# Patient Record
Sex: Female | Born: 1970 | Race: White | Hispanic: No | Marital: Married | Smoking: Never smoker
Health system: Southern US, Community
[De-identification: ages and names within clinical notes are randomized; demographics above are authoritative.]

## PROBLEM LIST (undated history)

## (undated) DIAGNOSIS — F419 Anxiety disorder, unspecified: Secondary | ICD-10-CM

## (undated) DIAGNOSIS — I10 Essential (primary) hypertension: Secondary | ICD-10-CM

## (undated) DIAGNOSIS — E063 Autoimmune thyroiditis: Secondary | ICD-10-CM

## (undated) DIAGNOSIS — E7800 Pure hypercholesterolemia, unspecified: Secondary | ICD-10-CM

## (undated) DIAGNOSIS — C966 Unifocal Langerhans-cell histiocytosis: Secondary | ICD-10-CM

## (undated) DIAGNOSIS — J45909 Unspecified asthma, uncomplicated: Secondary | ICD-10-CM

## (undated) HISTORY — PX: ENDOMETRIAL ABLATION: SHX621

## (undated) HISTORY — PX: NASAL SINUS SURGERY: SHX719

## (undated) HISTORY — PX: CHOLECYSTECTOMY: SHX55

---

## 2016-04-30 ENCOUNTER — Ambulatory Visit
Admission: RE | Admit: 2016-04-30 | Discharge: 2016-04-30 | Disposition: A | Discharge status: Home / Self Care | Payer: BLUE CROSS/BLUE SHIELD | Source: Ambulatory Visit | Attending: Obstetrics and Gynecology | Admitting: Obstetrics and Gynecology

## 2016-04-30 ENCOUNTER — Other Ambulatory Visit: Payer: Self-pay | Admitting: Obstetrics and Gynecology

## 2016-04-30 DIAGNOSIS — Z1231 Encounter for screening mammogram for malignant neoplasm of breast: Secondary | ICD-10-CM | POA: Diagnosis present

## 2016-05-10 ENCOUNTER — Inpatient Hospital Stay
Admission: RE | Admit: 2016-05-10 | Discharge: 2016-05-10 | Disposition: A | Discharge status: Home / Self Care | Payer: Self-pay | Source: Ambulatory Visit | Attending: *Deleted | Admitting: *Deleted

## 2016-05-10 ENCOUNTER — Other Ambulatory Visit: Payer: Self-pay | Admitting: *Deleted

## 2016-05-10 DIAGNOSIS — Z9289 Personal history of other medical treatment: Secondary | ICD-10-CM

## 2017-04-22 ENCOUNTER — Other Ambulatory Visit: Payer: Self-pay | Admitting: Obstetrics & Gynecology

## 2017-04-22 DIAGNOSIS — Z1231 Encounter for screening mammogram for malignant neoplasm of breast: Secondary | ICD-10-CM

## 2018-06-05 ENCOUNTER — Other Ambulatory Visit: Payer: Self-pay | Admitting: Obstetrics & Gynecology

## 2018-06-05 DIAGNOSIS — Z1231 Encounter for screening mammogram for malignant neoplasm of breast: Secondary | ICD-10-CM

## 2018-06-13 ENCOUNTER — Ambulatory Visit
Admission: RE | Admit: 2018-06-13 | Discharge: 2018-06-13 | Disposition: A | Discharge status: Home / Self Care | Payer: BLUE CROSS/BLUE SHIELD | Source: Ambulatory Visit | Attending: Obstetrics & Gynecology | Admitting: Obstetrics & Gynecology

## 2018-06-13 DIAGNOSIS — Z1231 Encounter for screening mammogram for malignant neoplasm of breast: Secondary | ICD-10-CM | POA: Diagnosis not present

## 2019-06-06 ENCOUNTER — Other Ambulatory Visit: Payer: Self-pay | Admitting: Obstetrics & Gynecology

## 2019-06-06 DIAGNOSIS — Z1231 Encounter for screening mammogram for malignant neoplasm of breast: Secondary | ICD-10-CM

## 2019-08-31 ENCOUNTER — Encounter: Payer: Self-pay | Admitting: Emergency Medicine

## 2019-08-31 ENCOUNTER — Telehealth: Payer: Self-pay | Admitting: Emergency Medicine

## 2019-08-31 ENCOUNTER — Other Ambulatory Visit: Payer: Self-pay

## 2019-08-31 ENCOUNTER — Emergency Department
Admission: EM | Admit: 2019-08-31 | Discharge: 2019-08-31 | Disposition: A | Discharge status: Home / Self Care | Payer: BLUE CROSS/BLUE SHIELD | Attending: Emergency Medicine | Admitting: Emergency Medicine

## 2019-08-31 DIAGNOSIS — R000 Tachycardia, unspecified: Secondary | ICD-10-CM | POA: Insufficient documentation

## 2019-08-31 DIAGNOSIS — Z5321 Procedure and treatment not carried out due to patient leaving prior to being seen by health care provider: Secondary | ICD-10-CM | POA: Insufficient documentation

## 2019-08-31 HISTORY — DX: Unifocal Langerhans-cell histiocytosis: C96.6

## 2019-08-31 HISTORY — DX: Pure hypercholesterolemia, unspecified: E78.00

## 2019-08-31 HISTORY — DX: Unspecified asthma, uncomplicated: J45.909

## 2019-08-31 HISTORY — DX: Autoimmune thyroiditis: E06.3

## 2019-08-31 HISTORY — DX: Anxiety disorder, unspecified: F41.9

## 2019-08-31 HISTORY — DX: Essential (primary) hypertension: I10

## 2019-08-31 LAB — COMPREHENSIVE METABOLIC PANEL
ALT: 15 U/L (ref 0–44)
AST: 20 U/L (ref 15–41)
Albumin: 4.2 g/dL (ref 3.5–5.0)
Alkaline Phosphatase: 59 U/L (ref 38–126)
Anion gap: 10 (ref 5–15)
BUN: 12 mg/dL (ref 6–20)
CO2: 24 mmol/L (ref 22–32)
Calcium: 9.2 mg/dL (ref 8.9–10.3)
Chloride: 103 mmol/L (ref 98–111)
Creatinine, Ser: 0.63 mg/dL (ref 0.44–1.00)
GFR calc Af Amer: >60 mL/min (ref >60)
GFR calc non Af Amer: >60 mL/min (ref >60)
Glucose, Bld: 129 mg/dL — ABNORMAL HIGH (ref 70–99)
Potassium: 3.6 mmol/L (ref 3.5–5.1)
Sodium: 137 mmol/L (ref 135–145)
Total Bilirubin: 0.7 mg/dL (ref 0.3–1.2)
Total Protein: 7.2 g/dL (ref 6.5–8.1)

## 2019-08-31 LAB — CBC WITH DIFFERENTIAL/PLATELET
Abs Immature Granulocytes: 0.06 10*3/uL (ref 0.00–0.07)
Basophils Absolute: 0.1 10*3/uL (ref 0.0–0.1)
Basophils Relative: 1 %
Eosinophils Absolute: 0 10*3/uL (ref 0.0–0.5)
Eosinophils Relative: 0 %
HCT: 41.5 % (ref 36.0–46.0)
Hemoglobin: 14.1 g/dL (ref 12.0–15.0)
Immature Granulocytes: 1 %
Lymphocytes Relative: 14 %
Lymphs Abs: 1.3 10*3/uL (ref 0.7–4.0)
MCH: 30.3 pg (ref 26.0–34.0)
MCHC: 34 g/dL (ref 30.0–36.0)
MCV: 89.1 fL (ref 80.0–100.0)
Monocytes Absolute: 0.5 10*3/uL (ref 0.1–1.0)
Monocytes Relative: 5 %
Neutro Abs: 7.1 10*3/uL (ref 1.7–7.7)
Neutrophils Relative %: 79 %
Platelets: 254 10*3/uL (ref 150–400)
RBC: 4.66 MIL/uL (ref 3.87–5.11)
RDW: 12.1 % (ref 11.5–15.5)
WBC: 9 10*3/uL (ref 4.0–10.5)
nRBC: 0 % (ref 0.0–0.2)

## 2019-08-31 LAB — TROPONIN I (HIGH SENSITIVITY): Troponin I (High Sensitivity): 4 ng/L (ref <18)

## 2019-08-31 NOTE — ED Triage Notes (Signed)
Patient ambulatory to triage with steady gait, without difficulty or distress noted, mask in place; pt reports tachycardia with no accomp symptoms; denies hx of same; st rx metoprolol for "anxiety"

## 2019-08-31 NOTE — Telephone Encounter (Signed)
Called patient due to lwot to inquire about condition and follow up plans. Says her rate came down this am.  She has appt with her dr on Monday.  I told her to return as needed an time.

## 2019-10-08 ENCOUNTER — Ambulatory Visit
Admission: RE | Admit: 2019-10-08 | Discharge: 2019-10-08 | Disposition: A | Discharge status: Home / Self Care | Payer: BC Managed Care – PPO | Source: Ambulatory Visit | Attending: Obstetrics & Gynecology | Admitting: Obstetrics & Gynecology

## 2019-10-08 ENCOUNTER — Other Ambulatory Visit: Payer: Self-pay

## 2019-10-08 DIAGNOSIS — Z1231 Encounter for screening mammogram for malignant neoplasm of breast: Secondary | ICD-10-CM | POA: Diagnosis not present

## 2020-05-10 IMAGING — MG DIGITAL SCREENING BILAT W/ TOMO W/ CAD
8 series · 8 of 24 positions shown · non-contrast
Comparison: Previous exam(s).

CLINICAL DATA: Screening.

EXAM:
DIGITAL SCREENING BILATERAL MAMMOGRAM WITH TOMO AND CAD

[L MLO synth-2D]
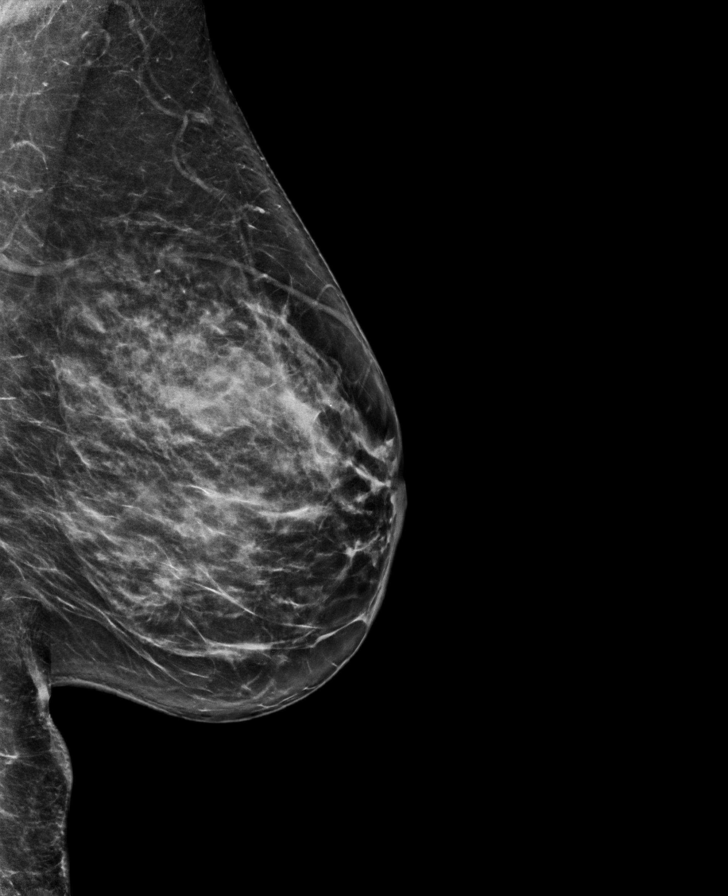

[R CC synth-2D]
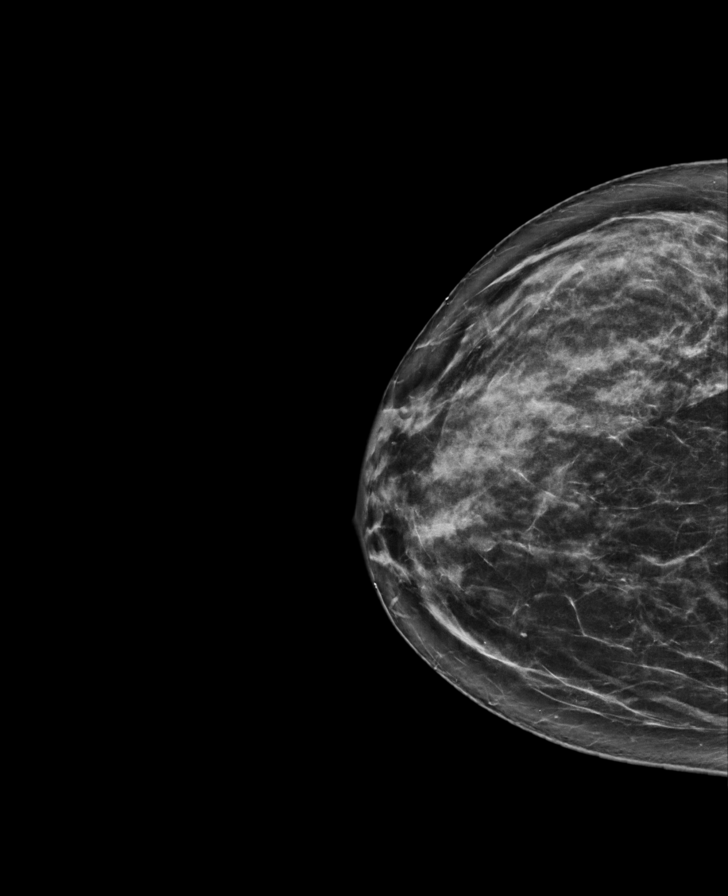

[L CC synth-2D]
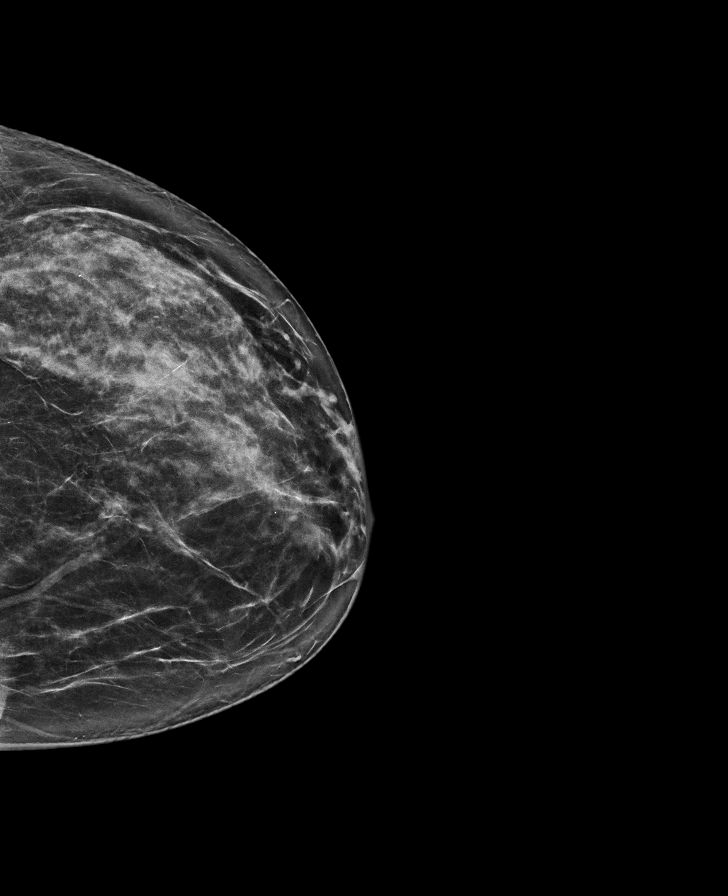

[R MLO synth-2D]
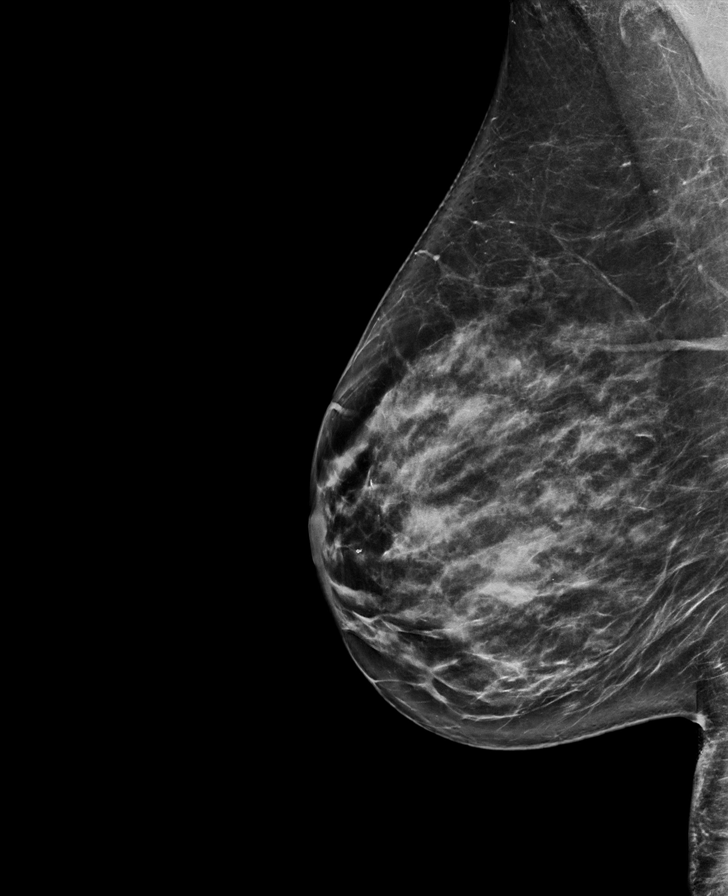

[R MLO tomo · tomo slice 40/79.0]
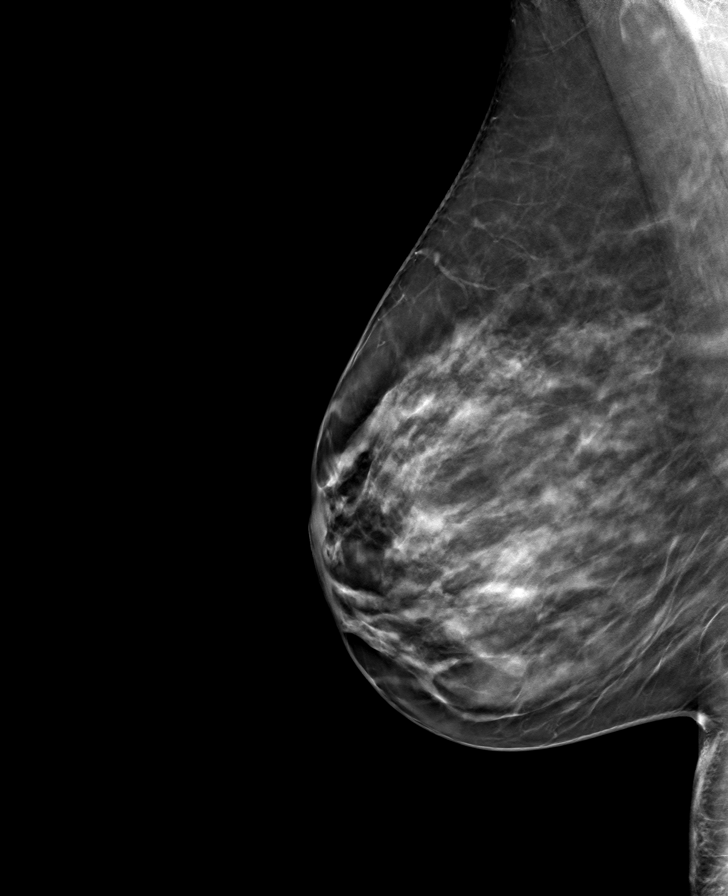

[L CC tomo · tomo slice 40/79.0]
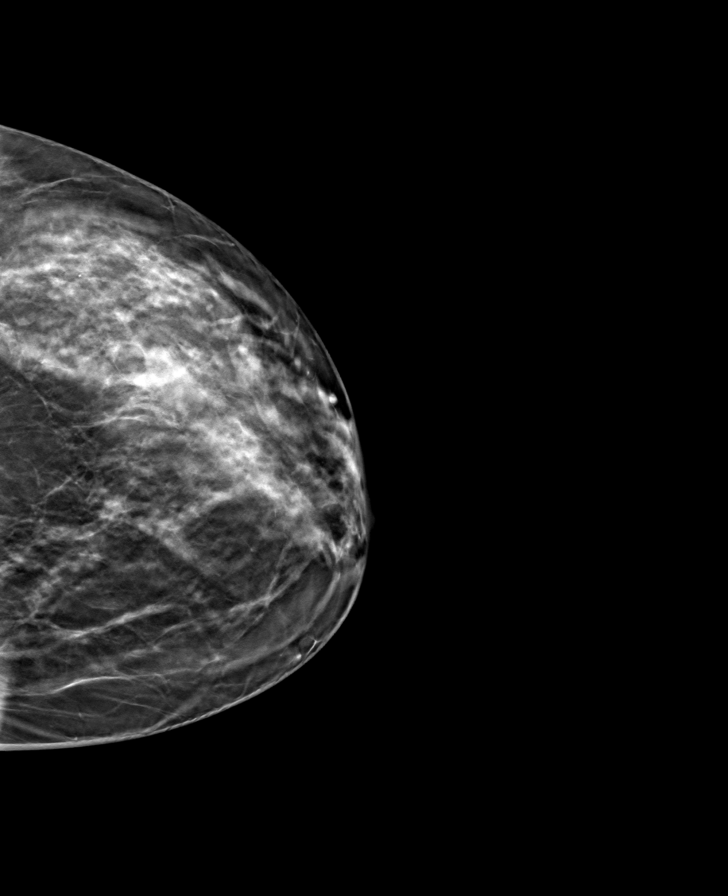

[L MLO tomo · tomo slice 39/76.0]
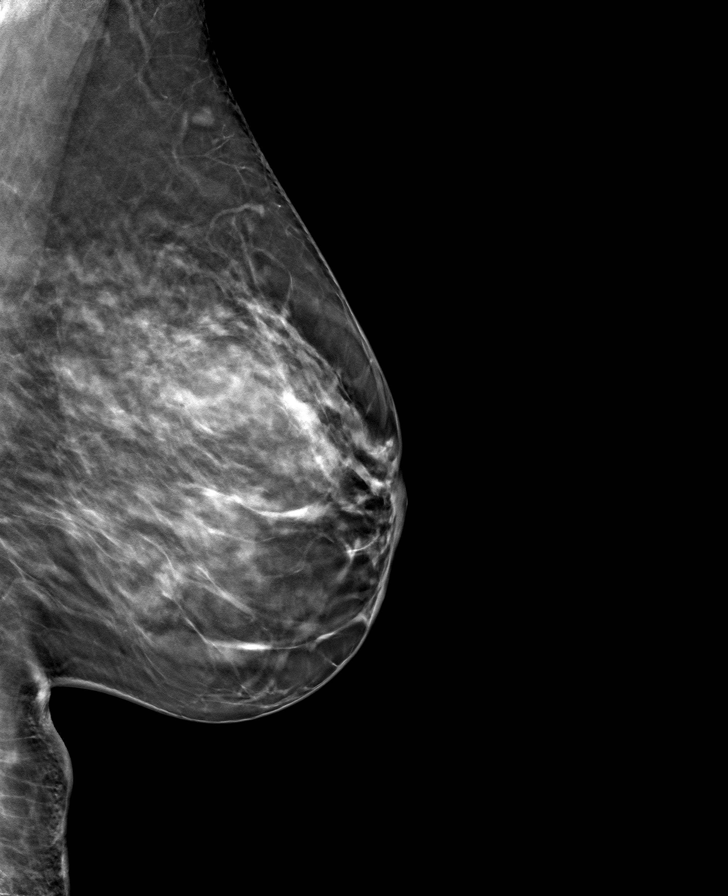

[R CC tomo · tomo slice 39/76.0]
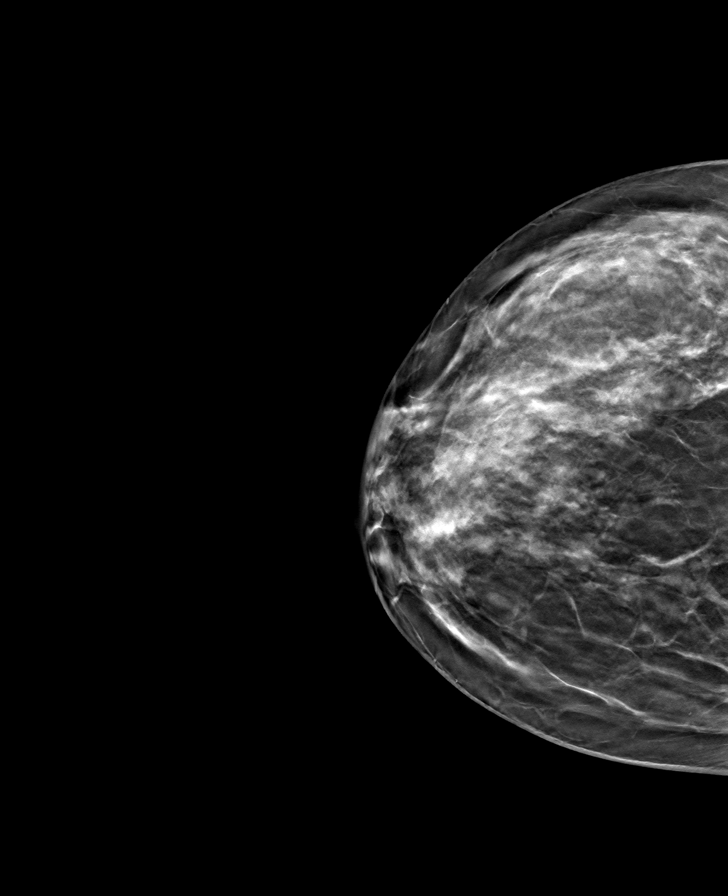

[8 of 24 positions shown; findings below may reference images not displayed]

ACR Breast Density Category c: The breast tissue is heterogeneously
dense, which may obscure small masses.
FINDINGS: There are no findings suspicious for malignancy. Images were
processed with CAD.
IMPRESSION: No mammographic evidence of malignancy. A result letter of this
screening mammogram will be mailed directly to the patient.

RECOMMENDATION:
Screening mammogram in one year. (Code:[id number])

BI-RADS CATEGORY  1: Negative.

## 2020-09-10 ENCOUNTER — Other Ambulatory Visit: Payer: Self-pay | Admitting: Obstetrics & Gynecology

## 2020-09-10 DIAGNOSIS — Z1231 Encounter for screening mammogram for malignant neoplasm of breast: Secondary | ICD-10-CM

## 2020-09-27 LAB — EXTERNAL GENERIC LAB PROCEDURE: COLOGUARD: NEGATIVE

## 2020-09-27 LAB — COLOGUARD: COLOGUARD: NEGATIVE

## 2020-10-22 ENCOUNTER — Ambulatory Visit
Admission: RE | Admit: 2020-10-22 | Discharge: 2020-10-22 | Disposition: A | Discharge status: Home / Self Care | Payer: Managed Care, Other (non HMO) | Source: Ambulatory Visit | Attending: Obstetrics & Gynecology | Admitting: Obstetrics & Gynecology

## 2020-10-22 ENCOUNTER — Other Ambulatory Visit: Payer: Self-pay

## 2020-10-22 DIAGNOSIS — Z1231 Encounter for screening mammogram for malignant neoplasm of breast: Secondary | ICD-10-CM | POA: Insufficient documentation

## 2021-03-23 ENCOUNTER — Other Ambulatory Visit: Payer: Self-pay | Admitting: Sports Medicine

## 2021-03-23 DIAGNOSIS — Z8579 Personal history of other malignant neoplasms of lymphoid, hematopoietic and related tissues: Secondary | ICD-10-CM

## 2021-03-23 DIAGNOSIS — G8929 Other chronic pain: Secondary | ICD-10-CM

## 2021-03-23 DIAGNOSIS — M25562 Pain in left knee: Secondary | ICD-10-CM

## 2021-03-23 DIAGNOSIS — M7989 Other specified soft tissue disorders: Secondary | ICD-10-CM

## 2021-04-03 ENCOUNTER — Other Ambulatory Visit: Payer: Self-pay

## 2021-04-03 ENCOUNTER — Ambulatory Visit
Admission: RE | Admit: 2021-04-03 | Discharge: 2021-04-03 | Disposition: A | Discharge status: Home / Self Care | Payer: Managed Care, Other (non HMO) | Source: Ambulatory Visit | Attending: Sports Medicine | Admitting: Sports Medicine

## 2021-04-03 DIAGNOSIS — G8929 Other chronic pain: Secondary | ICD-10-CM

## 2021-04-03 DIAGNOSIS — M25562 Pain in left knee: Secondary | ICD-10-CM | POA: Diagnosis present

## 2021-04-03 DIAGNOSIS — M7989 Other specified soft tissue disorders: Secondary | ICD-10-CM

## 2021-04-03 DIAGNOSIS — Z8579 Personal history of other malignant neoplasms of lymphoid, hematopoietic and related tissues: Secondary | ICD-10-CM

## 2021-06-09 ENCOUNTER — Other Ambulatory Visit: Payer: Self-pay | Admitting: Orthopedic Surgery

## 2021-06-09 DIAGNOSIS — M25562 Pain in left knee: Secondary | ICD-10-CM

## 2021-06-09 DIAGNOSIS — G8929 Other chronic pain: Secondary | ICD-10-CM

## 2021-08-28 ENCOUNTER — Other Ambulatory Visit: Payer: Self-pay | Admitting: Obstetrics and Gynecology

## 2021-08-28 DIAGNOSIS — Z1231 Encounter for screening mammogram for malignant neoplasm of breast: Secondary | ICD-10-CM

## 2021-10-23 ENCOUNTER — Ambulatory Visit
Admission: RE | Admit: 2021-10-23 | Discharge: 2021-10-23 | Disposition: A | Discharge status: Home / Self Care | Payer: Managed Care, Other (non HMO) | Source: Ambulatory Visit | Attending: Obstetrics and Gynecology | Admitting: Obstetrics and Gynecology

## 2021-10-23 DIAGNOSIS — Z1231 Encounter for screening mammogram for malignant neoplasm of breast: Secondary | ICD-10-CM | POA: Diagnosis present

## 2021-10-29 ENCOUNTER — Other Ambulatory Visit: Payer: Self-pay | Admitting: Obstetrics and Gynecology

## 2021-10-29 DIAGNOSIS — R921 Mammographic calcification found on diagnostic imaging of breast: Secondary | ICD-10-CM

## 2021-10-29 DIAGNOSIS — R928 Other abnormal and inconclusive findings on diagnostic imaging of breast: Secondary | ICD-10-CM

## 2021-11-08 ENCOUNTER — Ambulatory Visit
Admission: EM | Admit: 2021-11-08 | Discharge: 2021-11-08 | Disposition: A | Discharge status: Home / Self Care | Payer: Managed Care, Other (non HMO) | Attending: Family Medicine | Admitting: Family Medicine

## 2021-11-08 ENCOUNTER — Encounter: Payer: Self-pay | Admitting: Emergency Medicine

## 2021-11-08 DIAGNOSIS — K5730 Diverticulosis of large intestine without perforation or abscess without bleeding: Secondary | ICD-10-CM

## 2021-11-08 DIAGNOSIS — K589 Irritable bowel syndrome without diarrhea: Secondary | ICD-10-CM

## 2021-11-08 DIAGNOSIS — R1032 Left lower quadrant pain: Secondary | ICD-10-CM

## 2021-11-08 LAB — POCT URINALYSIS DIP (MANUAL ENTRY)
Bilirubin, UA: NEGATIVE
Blood, UA: NEGATIVE
Glucose, UA: NEGATIVE mg/dL
Ketones, POC UA: NEGATIVE mg/dL
Leukocytes, UA: NEGATIVE
Nitrite, UA: NEGATIVE
Protein Ur, POC: NEGATIVE mg/dL
Spec Grav, UA: <=1.005 — AB (ref 1.010–1.025)
Urobilinogen, UA: 0.2 E.U./dL
pH, UA: 6 (ref 5.0–8.0)

## 2021-11-08 MED ORDER — DICYCLOMINE HCL 20 MG PO TABS: 20.0000 mg | ORAL_TABLET | Freq: Three times a day (TID) | ORAL | 0 refills | Status: AC

## 2021-11-08 MED ORDER — AMOXICILLIN-POT CLAVULANATE 875-125 MG PO TABS: 1.0000 | ORAL_TABLET | Freq: Two times a day (BID) | ORAL | 0 refills | Status: AC

## 2021-11-08 NOTE — Discharge Instructions (Signed)
Treating you for suspected Diverticulitis. ?Take Augmentin twice daily for 10 days. ?Bentyl take with meals and at bedtime as needed for abdominal cramping.  ?Schedule a follow-up with GI within 7-10 days ?

## 2021-11-08 NOTE — ED Triage Notes (Signed)
Pt presents with left side abdominal pain near her belly button x 3 days. She describes the pain as a sharp gas pain. She does have a history of IBS she feels bloated and a lot of belching. She has a case of diarrhea this morning.  ?

## 2021-11-08 NOTE — ED Provider Notes (Addendum)
?National ? ? ? ?CSN: [id number] ?Arrival date & time: 11/08/21  1045 ? ? ?  ? ?History   ?Chief Complaint ?Chief Complaint  ?Patient presents with  ? Abdominal Pain  ? ? ?HPI ?Amber Holden is a 51 y.o. female.  ? ?HPI ?Patient presents for evaluation of left sided lower abdominal pain, bloating x 3 days and diarrhea today. She describes pain as occasionally sharp. Has a history of diverticular disease however has never had diverticulitis.Denies fever, chills.  Patient has history of a cholecystectomy.  Her appendix remains intact.  She denies any rectal bleeding or noticing blood in her stool.  She also has a history of IBS. Denies nausea or vomiting. No fever. ?Past Medical History:  ?Diagnosis Date  ? Anxiety   ? Asthma   ? 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 Hashimoto's thyroiditis   ? Hypercholesteremia   ? Hypertension   ? ? ?There are no problems to display for this patient. ? ? ?Past Surgical History:  ?Procedure Laterality Date  ? CHOLECYSTECTOMY    ? ENDOMETRIAL ABLATION    ? NASAL SINUS SURGERY    ? ? ?OB History   ?No obstetric history on file. ?  ? ? ? ?Home Medications   ? ?Prior to Admission medications   ?Medication Sig Start Date End Date Taking? Authorizing Provider  ?ALPRAZolam (XANAX) 0.25 MG tablet Take by mouth. 07/30/16  Yes [provider]  ?amoxicillin-clavulanate (AUGMENTIN) 875-125 MG tablet Take 1 tablet by mouth 2 (two) times daily. 11/08/21  Yes Scot Jun, FNP  ?cetirizine (ZYRTEC) 10 MG tablet Zyrtec 10 mg tablet ?  1 tablet every day by oral route.   Yes [provider]  ?Cholecalciferol 25 MCG (1000 UT) tablet Take by mouth.   Yes [provider]  ?dicyclomine (BENTYL) 20 MG tablet Take 1 tablet (20 mg total) by mouth 3 (three) times daily before meals for 7 days. 11/08/21 11/15/21 Yes Scot Jun, FNP  ?Krill Oil 1000 MG CAPS Take by mouth.   Yes [provider]  ?lansoprazole (PREVACID) 15 MG capsule  05/29/13  Yes [provider]  ?metoprolol succinate (TOPROL-XL) 50 MG 24 hr tablet Take 50 mg by mouth daily. 10/12/21  Yes [provider]  ?montelukast (SINGULAIR) 10 MG tablet Take 1 tablet by mouth at bedtime. 05/18/13  Yes [provider]  ?Omega-3 Fatty Acids (FISH OIL) 1000 MG CAPS Take by mouth.   Yes [provider]  ?QVAR REDIHALER 40 MCG/ACT inhaler Inhale 2 puffs into the lungs 2 (two) times daily. 07/17/21  Yes [provider]  ?rosuvastatin (CRESTOR) 10 MG tablet Crestor 10 mg tablet ?  1 tablet every other day by oral route. 02/21/15  Yes [provider]  ?saccharomyces boulardii (FLORASTOR) 250 MG capsule Take 250 mg by mouth 2 (two) times daily.   Yes [provider]  ? ? ?Family History ?Family History  ?Problem Relation Age of Onset  ? Breast cancer Maternal Aunt   ? ? ?Social History ?Social History  ? ?Tobacco Use  ? Smoking status: Never  ? Smokeless tobacco: Never  ?Vaping Use  ? Vaping Use: Never used  ? ? ? ?Allergies   ?Clarithromycin, Sulfamethoxazole-trimethoprim, Ciprofloxacin, Epinephrine, Fluconazole, Quinolones, Sulfa antibiotics, and Ondansetron ? ? ?Review of Systems ?Review of Systems ?Pertinent negatives listed in HPI  ?Physical Exam ?Triage Vital Signs ?ED Triage Vitals  ?Enc Vitals Group  ?   BP 11/08/21 1142 (!) 142/84  ?  Pulse Rate 11/08/21 1142 73  ?   Resp 11/08/21 1142 18  ?   Temp 11/08/21 1142 98.1 ?F (36.7 ?C)  ?   Temp Source 11/08/21 1142 Oral  ?   SpO2 11/08/21 1142 98 %  ?   Weight --   ?   Height --   ?   Head Circumference --   ?   Peak Flow --   ?   Pain Score 11/08/21 1139 1  ?   Pain Loc --   ?   Pain Edu? --   ?   Excl. in [place]? --   ? ?No data found. ? ?Updated Vital Signs ?BP (!) 142/84 (BP Location: Left Arm)   Pulse 73   Temp 98.1 ?F (36.7 ?C) (Oral)   Resp 18   SpO2 98%  ? ?Visual Acuity ?Right Eye Distance:   ?Left Eye Distance:   ?Bilateral Distance:   ? ?Right Eye Near:   ?Left Eye Near:    ?Bilateral Near:     ? ?Physical Exam ?Constitutional:   ?   General: She is not in acute distress. ?   Appearance: She is well-developed. She is not ill-appearing.  ?HENT:  ?   Head: Normocephalic and atraumatic.  ?Eyes:  ?   Extraocular Movements: Extraocular movements intact.  ?   Pupils: Pupils are equal, round, and reactive to light.  ?Cardiovascular:  ?   Rate and Rhythm: Normal rate and regular rhythm.  ?Abdominal:  ?   General: Abdomen is flat. Bowel sounds are normal. There is no distension.  ?   Palpations: Abdomen is soft.  ?   Tenderness: There is no abdominal tenderness.  ?   Hernia: No hernia is present.  ?Skin: ?   General: Skin is warm and dry.  ?   Capillary Refill: Capillary refill takes less than 2 seconds.  ?Neurological:  ?   General: No focal deficit present.  ?   Mental Status: She is alert.  ?Psychiatric:     ?   Mood and Affect: Mood normal.     ?   Behavior: Behavior normal.  ? ? ? ?UC Treatments / Results  ?Labs ?(all labs ordered are listed, but only abnormal results are displayed) ?Labs Reviewed  ?POCT URINALYSIS DIP (MANUAL ENTRY) - Abnormal; Notable for the following components:  ?    Result Value  ? Spec Grav, UA <=1.005 (*)   ? All other components within normal limits  ? ? ?EKG ? ? ?Radiology ? ? ?Procedures ?Procedures (including critical care time) ? ?Medications Ordered in UC ?Medications - No data to display ? ?Initial Impression / Assessment and Plan / UC Course  ?I have reviewed the triage vital signs and the nursing notes. ? ?Pertinent labs & imaging results that were available during my care of the patient were reviewed by me and considered in my medical decision making (see chart for details). ? ?  ?Treating for suspected diverticulitis with Augmentin and Dicyclomine. ?Strict ER precautions given if symptoms worsen and do no readily improve. Schedule a follow-up with GI within 7-10 days. Patient verbalized understanding and agreement with plan. ?Final Clinical Impressions(s) / UC Diagnoses   ? ?Final diagnoses:  ?Abdominal pain, left lower quadrant  ?Irritable bowel syndrome, unspecified type  ?Diverticula of colon  ? ? ? ?Discharge Instructions   ? ?  ?Treating you for suspected Diverticulitis. ?Take Augmentin twice daily for 10 days. ?Bentyl take with meals and at bedtime as needed  for abdominal cramping.  ?Schedule a follow-up with GI within 7-10 days ? ? ? ? ?ED Prescriptions   ? ? Medication Sig Dispense Auth. Provider  ? amoxicillin-clavulanate (AUGMENTIN) 875-125 MG tablet Take 1 tablet by mouth 2 (two) times daily. 20 tablet Scot Jun, FNP  ? dicyclomine (BENTYL) 20 MG tablet Take 1 tablet (20 mg total) by mouth 3 (three) times daily before meals for 7 days. 20 tablet Scot Jun, FNP  ? ?  ? ?PDMP not reviewed this encounter. ?  ?Scot Jun, FNP ?11/11/21 2319 ? ?  ?Scot Jun, FNP ?11/11/21 2320 ? ?

## 2021-11-11 ENCOUNTER — Ambulatory Visit
Admission: RE | Admit: 2021-11-11 | Discharge: 2021-11-11 | Disposition: A | Discharge status: Home / Self Care | Payer: Managed Care, Other (non HMO) | Source: Ambulatory Visit | Attending: Obstetrics and Gynecology | Admitting: Obstetrics and Gynecology

## 2021-11-11 ENCOUNTER — Encounter: Payer: Self-pay | Admitting: Radiology

## 2021-11-11 DIAGNOSIS — R921 Mammographic calcification found on diagnostic imaging of breast: Secondary | ICD-10-CM | POA: Insufficient documentation

## 2021-11-11 DIAGNOSIS — R928 Other abnormal and inconclusive findings on diagnostic imaging of breast: Secondary | ICD-10-CM | POA: Insufficient documentation

## 2023-02-10 ENCOUNTER — Other Ambulatory Visit: Payer: Self-pay | Admitting: Orthopedic Surgery

## 2023-02-10 DIAGNOSIS — M898X1 Other specified disorders of bone, shoulder: Secondary | ICD-10-CM

## 2023-02-14 ENCOUNTER — Encounter: Payer: Self-pay | Admitting: Orthopedic Surgery

## 2023-11-24 ENCOUNTER — Emergency Department: Admission: EM | Admit: 2023-11-24 | Discharge: 2023-11-24 | Discharge status: Against Medical Advice | Payer: Self-pay

## 2023-11-24 NOTE — ED Triage Notes (Signed)
 First nurse note: brought over from [facility]. Has been having rectal pressure and abdominal pain that started this AM. Normal BM. Reports bloating.   Nurse from [facility] reported that as soon as patient checked into ER she told staff she was leaving and left ER. Patient seen by [facility] staff walking out of ER.  Pharmacist, community

## 2024-03-05 ENCOUNTER — Ambulatory Visit

## 2024-03-05 ENCOUNTER — Encounter: Payer: Self-pay | Admitting: Gastroenterology

## 2024-03-05 ENCOUNTER — Ambulatory Visit
Admission: RE | Admit: 2024-03-05 | Discharge: 2024-03-05 | Disposition: A | Discharge status: Home / Self Care | Attending: Gastroenterology | Admitting: Gastroenterology

## 2024-03-05 ENCOUNTER — Encounter
Admission: RE | Disposition: A | Discharge status: Home / Self Care | Payer: Self-pay | Source: Home / Self Care | Attending: Gastroenterology

## 2024-03-05 DIAGNOSIS — Z8719 Personal history of other diseases of the digestive system: Secondary | ICD-10-CM | POA: Insufficient documentation

## 2024-03-05 DIAGNOSIS — Z923 Personal history of irradiation: Secondary | ICD-10-CM | POA: Insufficient documentation

## 2024-03-05 DIAGNOSIS — Z9221 Personal history of antineoplastic chemotherapy: Secondary | ICD-10-CM | POA: Insufficient documentation

## 2024-03-05 DIAGNOSIS — Z09 Encounter for follow-up examination after completed treatment for conditions other than malignant neoplasm: Secondary | ICD-10-CM | POA: Diagnosis present

## 2024-03-05 DIAGNOSIS — K56699 Other intestinal obstruction unspecified as to partial versus complete obstruction: Secondary | ICD-10-CM | POA: Insufficient documentation

## 2024-03-05 DIAGNOSIS — K5730 Diverticulosis of large intestine without perforation or abscess without bleeding: Secondary | ICD-10-CM | POA: Diagnosis not present

## 2024-03-05 HISTORY — PX: COLONOSCOPY: SHX5424

## 2024-03-05 LAB — POCT PREGNANCY, URINE: Preg Test, Ur: NEGATIVE

## 2024-03-05 SURGERY — COLONOSCOPY
Anesthesia: General

## 2024-03-05 MED ORDER — SODIUM CHLORIDE 0.9 % IV SOLN
INTRAVENOUS | Status: DC
  Administered 2024-03-05 (×2): 20 mL/h via INTRAVENOUS

## 2024-03-05 MED ORDER — PROPOFOL 500 MG/50ML IV EMUL
INTRAVENOUS | Status: DC | PRN
  Administered 2024-03-05 (×2): 50 mg via INTRAVENOUS
  Administered 2024-03-05 (×2): 150 ug/kg/min via INTRAVENOUS

## 2024-03-05 NOTE — Anesthesia Preprocedure Evaluation (Signed)
 Anesthesia Evaluation  Patient identified by MRN, date of birth, ID band Patient awake    Reviewed: Allergy & Precautions, H&P , NPO status , Patient's Chart, lab work & pertinent test results, reviewed documented beta blocker date and time   Airway Mallampati: II   Neck ROM: full    Dental  (+) Poor Dentition   Pulmonary neg shortness of breath, asthma    Pulmonary exam normal        Cardiovascular Exercise Tolerance: Good hypertension, On Medications negative cardio ROS Normal cardiovascular exam Rhythm:regular Rate:Normal     Neuro/Psych   Anxiety     negative neurological ROS  negative psych ROS   GI/Hepatic negative GI ROS, Neg liver ROS,,,  Endo/Other  negative endocrine ROS    Renal/GU negative Renal ROS  negative genitourinary   Musculoskeletal   Abdominal   Peds  Hematology negative hematology ROS (+)   Anesthesia Other Findings Past Medical History: No date: Anxiety No date: Asthma No date: Eosinophilic granuloma ([facility]) No date: Hashimoto's thyroiditis No date: Hypercholesteremia No date: Hypertension Past Surgical History: No date: CHOLECYSTECTOMY No date: ENDOMETRIAL ABLATION No date: NASAL SINUS SURGERY BMI    Body Mass Index: 25.09 kg/m     Reproductive/Obstetrics negative OB ROS                              Anesthesia Physical Anesthesia Plan  ASA: 2  Anesthesia Plan: General   Post-op Pain Management:    Induction:   PONV Risk Score and Plan:   Airway Management Planned:   Additional Equipment:   Intra-op Plan:   Post-operative Plan:   Informed Consent: I have reviewed the patients History and Physical, chart, labs and discussed the procedure including the risks, benefits and alternatives for the proposed anesthesia with the patient or authorized representative who has indicated his/her understanding and acceptance.     Dental Advisory  Given  Plan Discussed with: CRNA  Anesthesia Plan Comments:         Anesthesia Quick Evaluation

## 2024-03-05 NOTE — Interval H&P Note (Signed)
 History and Physical Interval Note: Preprocedure H&P from 03/05/24  was reviewed and there was no interval change after seeing and examining the patient.  Written consent was obtained from the patient after discussion of risks, benefits, and alternatives. Patient has consented to proceed with Colonoscopy with possible intervention   03/05/2024 11:13 AM  Santana Sharps  has presented today for surgery, with the diagnosis of Z87.19 (ICD-10-CM) - Hx of diverticulitis of colon.  The various methods of treatment have been discussed with the patient and family. After consideration of risks, benefits and other options for treatment, the patient has consented to  Procedure(s): COLONOSCOPY (N/A) as a surgical intervention.  The patient's history has been reviewed, patient examined, no change in status, stable for surgery.  I have reviewed the patient's chart and labs.  Questions were answered to the patient's satisfaction.     Elspeth Ozell Jungling

## 2024-03-05 NOTE — Op Note (Signed)
 [facility] Gastroenterology Patient Name: Amber Holden Procedure Date: 03/05/2024 11:04 AM MRN: [id number] Account #: [id number] Date of Birth: Dec 13, 1970 Admit Type: Outpatient Age: 53 Room: [facility] ENDO ROOM 2 Gender: Female Note Status: Finalized Instrument Name: Peds Colonoscope [id number] Procedure:             Colonoscopy Indications:           Follow-up of diverticulitis Providers:             Elspeth Ozell Jungling DO, DO Medicines:             Monitored Anesthesia Care Complications:         No immediate complications. Estimated blood loss: None. Procedure:             Pre-Anesthesia Assessment:                        - Prior to the procedure, a History and Physical was                         performed, and patient medications and allergies were                         reviewed. The patient is competent. The risks and                         benefits of the procedure and the sedation options and                         risks were discussed with the patient. All questions                         were answered and informed consent was obtained.                         Patient identification and proposed procedure were                         verified by the physician, the nurse, the anesthetist                         and the technician in the endoscopy suite. Mental                         Status Examination: alert and oriented. Airway                         Examination: normal oropharyngeal airway and neck                         mobility. Respiratory Examination: clear to                         auscultation. CV Examination: RRR, no murmurs, no S3                         or S4. Prophylactic Antibiotics: The patient does not                         require  prophylactic antibiotics. Prior                         Anticoagulants: The patient has taken no anticoagulant                         or antiplatelet agents. ASA Grade Assessment: II - A                          patient with mild systemic disease. After reviewing                         the risks and benefits, the patient was deemed in                         satisfactory condition to undergo the procedure. The                         anesthesia plan was to use monitored anesthesia care                         (MAC). Immediately prior to administration of                         medications, the patient was re-assessed for adequacy                         to receive sedatives. The heart rate, respiratory                         rate, oxygen saturations, blood pressure, adequacy of                         pulmonary ventilation, and response to care were                         monitored throughout the procedure. The physical                         status of the patient was re-assessed after the                         procedure.                        After obtaining informed consent, the colonoscope was                         passed under direct vision. Throughout the procedure,                         the patient's blood pressure, pulse, and oxygen                         saturations were monitored continuously. The                         Colonoscope was introduced through the anus and  advanced to the the cecum, identified by the ileocecal                         valve. The colonoscopy was performed with moderate                         difficulty due to multiple diverticula in the colon                         and restricted mobility, luminal narrowing. Successful                         completion of the procedure was aided by changing the                         patient to a prone position, withdrawing the scope and                         replacing with the adult endoscope, applying abdominal                         pressure and lavage. The patient tolerated the                         procedure well. The quality of the bowel preparation                          was evaluated using the BBPS [facility] Bowel Preparation                         Scale) with scores of: Right Colon = 3, Transverse                         Colon = 3 and Left Colon = 3 (entire mucosa seen well                         with no residual staining, small fragments of stool or                         opaque liquid). The total BBPS score equals 9. The                         terminal ileum, ileocecal valve, appendiceal orifice,                         and rectum were photographed. Findings:      The perianal and digital rectal examinations were normal. Pertinent       negatives include normal sphincter tone.      The terminal ileum appeared normal. Estimated blood loss: none.      Multiple small-mouthed diverticula were found in the sigmoid colon.       Estimated blood loss: none.      A benign-appearing, intrinsic moderate stenosis measuring 5 cm (in       length) x 1.2 cm (inner diameter) was found at 20 cm proximal to the       anus and was traversed. Able to be traversed  once pediatric colonoscope       was exchanged for gastroscope.      The exam was otherwise without abnormality on direct and retroflexion       views. Impression:            - The examined portion of the ileum was normal.                        - Diverticulosis in the sigmoid colon.                        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                        - The examination was otherwise normal on direct and                         retroflexion views.                        - No specimens collected. Recommendation:        - Patient has a contact number available for                         emergencies. The signs and symptoms of potential                         delayed complications were discussed with the patient.                         Return to normal activities tomorrow. Written                         discharge instructions were provided to the patient.                        - Discharge  patient to home.                        - Resume previous diet.                        - Continue present medications.                        - Repeat colonoscopy in 10 years for screening                         purposes.                        - Return to referring physician as previously                         scheduled.                        - The findings and recommendations were discussed with                         the patient. Procedure Code(s):     --- Professional ---  54621, Colonoscopy, flexible; diagnostic, including                         collection of specimen(s) by brushing or washing, when                         performed (separate procedure) Diagnosis Code(s):     --- Professional ---                        X43.300, Other intestinal obstruction unspecified as                         to partial versus complete obstruction                        K57.32, Diverticulitis of large intestine without                         perforation or abscess without bleeding                        K57.30, Diverticulosis of large intestine without                         perforation or abscess without bleeding CPT copyright 2022 American Medical Association. All rights reserved. The codes documented in this report are preliminary and upon coder review may  be revised to meet current compliance requirements. Attending Participation:      I personally performed the entire procedure. Elspeth Jungling, DO Elspeth Ozell Jungling DO, DO 03/05/2024 12:01:47 PM This report has been signed electronically. Number of Addenda: 0 Note Initiated On: 03/05/2024 11:04 AM Scope Withdrawal Time: 0 hours 9 minutes 10 seconds  Total Procedure Duration: 0 hours 28 minutes 33 seconds  Estimated Blood Loss:  Estimated blood loss: none.      [facility]

## 2024-03-05 NOTE — Transfer of Care (Signed)
 Immediate Anesthesia Transfer of Care Note  Patient: Amber Holden  Procedure(s) Performed: COLONOSCOPY  Patient Location: Endoscopy Unit  Anesthesia Type:General  Level of Consciousness: drowsy  Airway & Oxygen Therapy: Patient Spontanous Breathing  Post-op Assessment: Report given to RN and Post -op Vital signs reviewed and stable  Post vital signs: Reviewed and stable  Last Vitals:  Vitals Value Taken Time  BP 114/70 03/05/24 11:54  Temp    Pulse 66 03/05/24 11:54  Resp 21 03/05/24 11:54  SpO2 100 % 03/05/24 11:54  Vitals shown include unfiled device data.  Last Pain:  Vitals:   03/05/24 1049  TempSrc: Temporal  PainSc: 0-No pain         Complications: There were no known notable events for this encounter.

## 2024-03-05 NOTE — H&P (Signed)
 Pre-Procedure H&P   Patient ID: Amber Holden is a 53 y.o. female.  Gastroenterology Provider: Elspeth Ozell Jungling, DO  Referring Provider: Jonette Primmer, PA PCP: Valora Agent, MD  Date: 03/05/2024  HPI Amber Holden is a 53 y.o. female who presents today for Colonoscopy for Diverticulitis follow-up .  Initial colonoscopy.  Patient suffered from diverticulitis with complication specifically perforation in May 2025.  She was treated with antibiotics at [facility].  She did have a recurrent episode in June which was also treated with antibiotics.  She has been evaluated by surgery for one of the colonoscopy prior to any surgical intervention.  FOBT negative x 2 in April 2024.  Cologuard - February 2022.  No family history of colon cancer or colon polyps  Reports irregular bowel movements.  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  HENT:  Negative for trouble swallowing and voice change.   Respiratory:  Negative for shortness of breath and wheezing.   Cardiovascular:  Negative for chest pain, palpitations and leg swelling.  Gastrointestinal:  Positive for abdominal pain. Negative for abdominal distention, anal bleeding, blood in stool, constipation, diarrhea, nausea, rectal pain and vomiting.  Musculoskeletal:  Negative for arthralgias and myalgias.  Skin:  Negative for color  change and pallor.  Neurological:  Negative for dizziness, syncope and weakness.  Psychiatric/Behavioral:  Negative for confusion.   All other systems reviewed and are negative.    Medications No current facility-administered medications on file prior to encounter.   Current Outpatient Medications on File Prior to Encounter  Medication Sig Dispense Refill   ALPRAZolam (XANAX) 0.25 MG tablet Take by mouth.     amoxicillin -clavulanate (AUGMENTIN ) 875-125 MG tablet Take 1 tablet by mouth 2 (two) times daily. 20 tablet 0   cetirizine (ZYRTEC) 10 MG tablet Zyrtec 10 mg tablet   1 tablet every day by oral route.     Cholecalciferol 25 MCG (1000 UT) tablet Take by mouth.     Krill Oil 1000 MG CAPS Take by mouth.     lansoprazole (PREVACID) 15 MG capsule      metoprolol succinate (TOPROL-XL) 50 MG 24 hr tablet Take 50 mg by mouth daily.     montelukast (SINGULAIR) 10 MG tablet Take 1 tablet by mouth at bedtime.     Omega-3 Fatty Acids (FISH OIL) 1000 MG CAPS Take by mouth.     QVAR REDIHALER 40 MCG/ACT inhaler Inhale 2 puffs into the lungs 2 (two) times daily.     rosuvastatin (CRESTOR) 10 MG tablet Crestor 10 mg tablet   1 tablet every other day by oral route.     saccharomyces boulardii (FLORASTOR) 250 MG capsule Take 250 mg by mouth 2 (two) times daily.     dicyclomine  (BENTYL ) 20 MG tablet Take 1 tablet (20 mg total) by mouth 3 (three) times daily before meals  for 7 days. 20 tablet 0    Pertinent medications related to GI and procedure were reviewed by me with the patient prior to the procedure   Current Facility-Administered Medications:    0.9 %  sodium chloride  infusion, , Intravenous, Continuous, Onita Elspeth Sharper, DO, Last Rate: 20 mL/hr at 03/05/24 1058, 20 mL/hr at 03/05/24 1058  sodium chloride  20 mL/hr (03/05/24 1058)       Allergies  Allergen Reactions   Clarithromycin Hives   Sulfamethoxazole-Trimethoprim Anaphylaxis   Ciprofloxacin    Epinephrine Other (See  Comments)    Prolonged QT   Fluconazole     Other reaction(s): Other (See Comments) Prolonged QT   Quinolones    Sulfa Antibiotics    Ondansetron Anxiety    Other reaction(s): Other (See Comments) Vertigo-  Vertigo-     Allergies were reviewed by me prior to the procedure  Objective   Body mass index is 25.09 kg/m. Vitals:   03/05/24 1049  BP: (!) 161/93  Pulse: 93  Resp: 20  Temp: (!) 96.6 F (35.9 C)  TempSrc: Temporal  SpO2: 100%  Weight: 60.2 kg  Height: 5' 1 (1.549 m)     Physical Exam Vitals and nursing note reviewed.  Constitutional:      General: She is not in acute distress.    Appearance: Normal appearance. She is not ill-appearing, toxic-appearing or diaphoretic.  HENT:     Head: Normocephalic and atraumatic.     Nose: Nose normal.     Mouth/Throat:     Mouth: Mucous membranes are moist.     Pharynx: Oropharynx is clear.  Eyes:     General: No scleral icterus.    Extraocular Movements: Extraocular movements intact.  Cardiovascular:     Rate and Rhythm: Normal rate and regular rhythm.     Heart sounds: Normal heart sounds. No murmur heard.    No friction rub. No gallop.  Pulmonary:     Effort: Pulmonary effort is normal. No respiratory distress.     Breath sounds: Normal breath sounds. No wheezing, rhonchi or rales.  Abdominal:     General: Bowel sounds are normal. There is no distension.     Palpations: Abdomen is soft.     Tenderness: There is no abdominal tenderness. There is no guarding or rebound.  Musculoskeletal:     Cervical back: Neck supple.     Right lower leg: No edema.     Left lower leg: No edema.  Skin:    General: Skin is warm and dry.     Coloration: Skin is not jaundiced or pale.  Neurological:     General: No focal deficit present.     Mental Status: She is alert and oriented to person, place, and time. Mental status is at baseline.  Psychiatric:        Mood and Affect: Mood normal.        Behavior: Behavior normal.         Thought Content: Thought content normal.        Judgment: Judgment normal.      Assessment:  Amber Holden is a 53 y.o. female  who presents today for Colonoscopy for Diverticulitis follow-up .  Plan:  Colonoscopy with possible intervention today  Colonoscopy with possible biopsy, control of bleeding, polypectomy, and interventions as necessary has been discussed with the patient/patient representative. Informed consent was obtained from the patient/patient representative after explaining the indication, nature, and risks of the procedure including but not limited  to death, bleeding, perforation, missed neoplasm/lesions, cardiorespiratory compromise, and reaction to medications. Opportunity for questions was given and appropriate answers were provided. Patient/patient representative has verbalized understanding is amenable to undergoing the procedure.   Elspeth Ozell Jungling, DO  [facility]  Portions of the record may have been created with voice recognition software. Occasional wrong-word or 'sound-a-like' substitutions may have occurred due to the inherent limitations of voice recognition software.  Read the chart carefully and recognize, using context, where substitutions may have occurred.

## 2024-03-06 ENCOUNTER — Encounter: Payer: Self-pay | Admitting: Gastroenterology

## 2024-03-08 NOTE — Anesthesia Postprocedure Evaluation (Signed)
 Anesthesia Post Note  Patient: Amber Holden  Procedure(s) Performed: COLONOSCOPY  Patient location during evaluation: PACU Anesthesia Type: General Level of consciousness: awake and alert Pain management: pain level controlled Vital Signs Assessment: post-procedure vital signs reviewed and stable Respiratory status: spontaneous breathing, nonlabored ventilation, respiratory function stable and patient connected to nasal cannula oxygen Cardiovascular status: blood pressure returned to baseline and stable Postop Assessment: no apparent nausea or vomiting Anesthetic complications: no   There were no known notable events for this encounter.   Last Vitals:  Vitals:   03/05/24 1200 03/05/24 1210  BP: 122/70 128/75  Pulse: 74 67  Resp: (!) 23 12  Temp:    SpO2: 100% 100%    Last Pain:  Vitals:   03/06/24 0751  TempSrc:   PainSc: 0-No pain                 Lynwood KANDICE Clause

## 2024-04-04 NOTE — Anesthesia Postprocedure Evaluation (Signed)
 Anesthesia Post Note  Patient: Amber Holden  Procedure(s) Performed: COLONOSCOPY  Patient location during evaluation: PACU Anesthesia Type: General Level of consciousness: awake and alert Pain management: pain level controlled Vital Signs Assessment: post-procedure vital signs reviewed and stable Respiratory status: spontaneous breathing, nonlabored ventilation, respiratory function stable and patient connected to nasal cannula oxygen Cardiovascular status: blood pressure returned to baseline and stable Postop Assessment: no apparent nausea or vomiting Anesthetic complications: no   There were no known notable events for this encounter.   Last Vitals:  Vitals:   03/05/24 1200 03/05/24 1210  BP: 122/70 128/75  Pulse: 74 67  Resp: (!) 23 12  Temp:    SpO2: 100% 100%    Last Pain:  Vitals:   03/06/24 0751  TempSrc:   PainSc: 0-No pain                 Lynwood KANDICE Clause
